# Patient Record
Sex: Female | Born: 2000 | Race: Black or African American | Hispanic: No | Marital: Single | Smoking: Never smoker
Health system: Southern US, Community
[De-identification: ages and names within clinical notes are randomized; demographics above are authoritative.]

## PROBLEM LIST (undated history)

## (undated) DIAGNOSIS — F319 Bipolar disorder, unspecified: Secondary | ICD-10-CM

## (undated) DIAGNOSIS — N390 Urinary tract infection, site not specified: Secondary | ICD-10-CM

## (undated) HISTORY — PX: MYRINGOTOMY: SUR874

## (undated) HISTORY — PX: TONSILLECTOMY: SUR1361

## (undated) HISTORY — PX: ADENOIDECTOMY: SUR15

## (undated) HISTORY — DX: Bipolar disorder, unspecified: F31.9

## (undated) HISTORY — PX: THROAT SURGERY: SHX803

---
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
## 2019-04-19 ENCOUNTER — Ambulatory Visit (HOSPITAL_COMMUNITY)
Admission: EM | Admit: 2019-04-19 | Discharge: 2019-04-19 | Disposition: A | Discharge status: Home / Self Care | Attending: Family Medicine | Admitting: Family Medicine

## 2019-04-19 ENCOUNTER — Other Ambulatory Visit: Payer: Self-pay

## 2019-04-19 DIAGNOSIS — S51812A Laceration without foreign body of left forearm, initial encounter: Secondary | ICD-10-CM

## 2019-04-19 DIAGNOSIS — L089 Local infection of the skin and subcutaneous tissue, unspecified: Secondary | ICD-10-CM

## 2019-04-19 DIAGNOSIS — Z7289 Other problems related to lifestyle: Secondary | ICD-10-CM | POA: Diagnosis not present

## 2019-04-19 DIAGNOSIS — IMO0002 Reserved for concepts with insufficient information to code with codable children: Secondary | ICD-10-CM

## 2019-04-19 DIAGNOSIS — T148XXA Other injury of unspecified body region, initial encounter: Secondary | ICD-10-CM | POA: Diagnosis not present

## 2019-04-19 MED ORDER — CEPHALEXIN 500 MG PO CAPS: 500.0000 mg | ORAL_CAPSULE | Freq: Four times a day (QID) | ORAL | 0 refills | Status: AC

## 2019-04-19 NOTE — ED Triage Notes (Signed)
Pt reports that she inflicted self-wounds to left wrist with razor approx 3 days ago. Reports area of cuts have had some yellow drainage onset yesterday. Pt denies any thoughts of self-harm/death at this time. Pt reports that she has done self-harm of similar actions approx 1 year ago. Pt states she often forgets to take her risperdal and lamictal.

## 2019-04-19 NOTE — ED Provider Notes (Signed)
[facility]    CSN: [id number] Arrival date & time: 04/19/19  1407      History   Chief Complaint Chief Complaint  Patient presents with  . Laceration    HPI Rhonda Moran is a 19 y.o. female history of bipolar type I, presenting today for concerns about infection to self-inflicted wounds.  Patient reports approximately 3 days ago she inflicted wounds to her left wrist with a razor.  She is concerned as of recently she has developed increased redness, pustular drainage as well as wound worsening.  Denies fevers.  Denies difficulty moving wrist elbow or fingers.  She denies any current thoughts of SI/HI.  She is on Risperdal and Lamictal, occasionally will forget to take medicines.  She currently does not wish to discuss any triggers or stresses in life right now.  She previously was seeing psychiatry, but has not since the Covid pandemic began 1 year ago.  HPI  Past Medical History:  Diagnosis Date  . Bipolar 1 disorder (HCC)   . Urinary tract infection     Patient Active Problem List   Diagnosis Date Noted  . 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 Bipolar I disorder (HCC) 02/10/2016  . Bartholin gland cyst 10/07/2014  . Patient has active power of attorney for health care 09/12/2014  . Abnormal vision screen 09/08/2014  . Bipolar disorder (HCC) 09/16/2013    Past Surgical History:  Procedure Laterality Date  . ADENOIDECTOMY    . MYRINGOTOMY Bilateral   . THROAT SURGERY    . TONSILLECTOMY      OB History    Gravida  0   Para  0   Term  0   Preterm  0   AB  0   Living  0     SAB  0   TAB  0   Ectopic  0   Multiple  0   Live Births               Home Medications    Prior to Admission medications   Medication Sig Start Date End Date Taking? Authorizing Provider  lamoTRIgine (LAMICTAL) 100 MG tablet Take 100 mg by mouth daily.   Yes [provider]   risperiDONE (RISPERDAL) 0.5 MG tablet Take 0.5 mg by mouth at bedtime.   Yes [provider]  ARIPiprazole (ABILIFY) 5 MG tablet Take 1 tablet (5 mg total) by mouth at bedtime. 02/13/16   Starkes-Perry, Juel Burrow, FNP  cephALEXin (KEFLEX) 500 MG capsule Take 1 capsule (500 mg total) by mouth 4 (four) times daily for 7 days. 04/19/19 04/26/19  Emmett Bracknell C, PA-C  ondansetron (ZOFRAN-ODT) 4 MG disintegrating tablet Take 1 tablet (4 mg total) by mouth every 8 (eight) hours as needed for nausea or vomiting. 07/10/17   Georgetta Haber, NP  valACYclovir (VALTREX) 500 MG tablet Take 1 tablet (500 mg total) by mouth daily. Patient not taking: Reported on 07/12/2017 03/06/17   Allayne Butcher B, PA-C  gabapentin (NEURONTIN) 300 MG capsule Take 300 mg by mouth daily after breakfast.  04/19/19  [provider]    Family History Family History  Problem Relation Age of Onset  . Depression Sister   . Drug abuse Maternal Grandfather     Social History Social History   Tobacco Use  . Smoking status: Never Smoker  . Smokeless tobacco: Never Used  Substance Use Topics  .  Alcohol use: No    Alcohol/week: 0.0 standard drinks  . Drug use: Yes    Types: Marijuana     Allergies   Patient has no known allergies.   Review of Systems Review of Systems  Constitutional: Negative for fatigue and fever.  Eyes: Negative for visual disturbance.  Respiratory: Negative for shortness of breath.   Cardiovascular: Negative for chest pain.  Gastrointestinal: Negative for abdominal pain, nausea and vomiting.  Musculoskeletal: Negative for arthralgias and joint swelling.  Skin: Positive for color change and wound. Negative for rash.  Neurological: Negative for dizziness, weakness, light-headedness and headaches.  Psychiatric/Behavioral: Positive for self-injury. Negative for suicidal ideas.     Physical Exam Triage Vital Signs ED Triage Vitals  Enc Vitals Group     BP 04/19/19 1426 117/76      Pulse Rate 04/19/19 1426 69     Resp 04/19/19 1426 18     Temp 04/19/19 1426 98.3 F (36.8 C)     Temp Source 04/19/19 1426 Oral     SpO2 04/19/19 1426 100 %     Weight --      Height --      Head Circumference --      Peak Flow --      Pain Score 04/19/19 1421 3     Pain Loc --      Pain Edu? --      Excl. in [place]? --    No data found.  Updated Vital Signs BP 117/76 (BP Location: Right Arm)   Pulse 69   Temp 98.3 F (36.8 C) (Oral)   Resp 18   LMP 03/21/2019   SpO2 100%   Visual Acuity Right Eye Distance:   Left Eye Distance:   Bilateral Distance:    Right Eye Near:   Left Eye Near:    Bilateral Near:     Physical Exam Vitals and nursing note reviewed.  Constitutional:      Appearance: She is well-developed.     Comments: No acute distress  HENT:     Head: Normocephalic and atraumatic.     Nose: Nose normal.  Eyes:     Conjunctiva/sclera: Conjunctivae normal.  Cardiovascular:     Rate and Rhythm: Normal rate.  Pulmonary:     Effort: Pulmonary effort is normal. No respiratory distress.  Abdominal:     General: There is no distension.  Musculoskeletal:        General: Normal range of motion.     Cervical back: Neck supple.     Comments: Full active range of motion at wrists elbows and all fingers, radial pulse 2+  Skin:    General: Skin is warm and dry.     Comments: Multiple horizontal self-inflicted wounds noted to left forearm, largely healing and scabbed, 1 area with surrounding erythema, wound assistance and small amount of drainage  Neurological:     Mental Status: She is alert and oriented to person, place, and time.      UC Treatments / Results  Labs (all labs ordered are listed, but only abnormal results are displayed) Labs Reviewed - No data to display  EKG   Radiology No results found.  Procedures Procedures (including critical care time)  Medications Ordered in UC Medications - No data to display  Initial Impression /  Assessment and Plan / UC Course  I have reviewed the triage vital signs and the nursing notes.  Pertinent labs & imaging results that were available during my care  of the patient were reviewed by me and considered in my medical decision making (see chart for details).     Localized area and concerning for infection/cellulitis.  Initiated on Keflex, discussed wound care and monitoring for gradual healing.  Patient denies being risk to self or others at this time.  Stressed importance of reestablishing care with primary care/psychiatry for further monitoring and management of bipolar.  Advised to follow-up in [place] for behavioral [facility] if developing recurrent self harm thoughts. Resources provided.  Discussed strict return precautions. Patient verbalized understanding and is agreeable with plan.  Final Clinical Impressions(s) / UC Diagnoses   Final diagnoses:  Laceration of left forearm, initial encounter  Wound infection  Self-inflicted injury     Discharge Instructions     Please begin taking Keflex 4 times a day for the next week Please wash area with warm soapy water twice daily, keep clean and dry Monitor for wounds to gradually heal with time  Please follow-up with primary care/psychiatry for further management of your Risperdal/Lamictal If you develop thoughts of harming yourself or others please follow-up at [facility] emergency room or go to behavioral health clinic   ED Prescriptions    Medication Sig Dispense Auth. Provider   cephALEXin (KEFLEX) 500 MG capsule Take 1 capsule (500 mg total) by mouth 4 (four) times daily for 7 days. 28 capsule Josceline Chenard, [place] C, PA-C     PDMP not reviewed this encounter.   Lew Dawes, [place] 04/19/19 1448

## 2019-04-19 NOTE — Discharge Instructions (Addendum)
Please begin taking Keflex 4 times a day for the next week Please wash area with warm soapy water twice daily, keep clean and dry Monitor for wounds to gradually heal with time  Please follow-up with primary care/psychiatry for further management of your Risperdal/Lamictal If you develop thoughts of harming yourself or others please follow-up at [facility] emergency room or go to behavioral health clinic

## 2019-07-10 ENCOUNTER — Ambulatory Visit (HOSPITAL_COMMUNITY)
Admission: EM | Admit: 2019-07-10 | Discharge: 2019-07-10 | Disposition: A | Discharge status: Home / Self Care | Attending: Family Medicine | Admitting: Family Medicine

## 2019-07-10 ENCOUNTER — Other Ambulatory Visit: Payer: Self-pay

## 2019-07-10 ENCOUNTER — Encounter (HOSPITAL_COMMUNITY): Payer: Self-pay

## 2019-07-10 DIAGNOSIS — L02412 Cutaneous abscess of left axilla: Secondary | ICD-10-CM | POA: Diagnosis not present

## 2019-07-10 MED ORDER — DOXYCYCLINE HYCLATE 100 MG PO CAPS: 100.0000 mg | ORAL_CAPSULE | Freq: Two times a day (BID) | ORAL | 0 refills | Status: DC

## 2019-07-10 NOTE — ED Provider Notes (Signed)
[place]   [id number] 07/10/19 Arrival Time: 1406  ASSESSMENT & PLAN:  1. Abscess of axilla, left     Though recommended, she declines I&D today. Prefers trial of antibiotic and hot soaks that have resulted in some drainage. OTC analgesics if needed. Agrees to return if not improving over the next 48 hours.  Begin: Meds ordered this encounter  Medications   doxycycline (VIBRAMYCIN) 100 MG capsule    Sig: Take 1 capsule (100 mg total) by mouth 2 (two) times daily.    Dispense:  20 capsule    Refill:  0    Reviewed expectations re: course of current medical issues. Questions answered. Outlined signs and symptoms indicating need for more acute intervention. Patient verbalized understanding. After Visit Summary given.   SUBJECTIVE:  Rhonda Moran is a 19 y.o. female who presents with a possible infection of her L axilla. Onset gradual, approximately a few days ago; some drainage yesterday after hot compress; without active drainage and without active bleeding. Symptoms have gradually worsened since beginning. Fever: absent. Distant h/o similar.  OBJECTIVE:  Vitals:   07/10/19 1420  BP: 112/67  Pulse: 80  Resp: 18  Temp: 99.7 F (37.6 C)  TempSrc: Oral  SpO2: 98%     General appearance: alert; no distress Skin: approx 1 x 2 cm induration of her L axilla; very fluctuance; tender to touch; no active drainage or bleeding Psychological: alert and cooperative; normal mood and affect  No Known Allergies  Past Medical History:  Diagnosis Date   Bipolar 1 disorder (HCC)    Urinary tract infection    Social History   Socioeconomic History   Marital status: Single    Spouse name: Not on file   Number of children: Not on file   Years of education: Not on file   Highest education level: Not on file  Occupational History   Not on file  Tobacco Use   Smoking status: Never Smoker   Smokeless tobacco: Never Used  Vaping Use   Vaping Use:  Never used  Substance and Sexual Activity   Alcohol use: No    Alcohol/week: 0.0 standard drinks   Drug use: Yes    Types: Marijuana   Sexual activity: Yes    Comment: stopped taking pill 01/05/16  Other Topics Concern   Not on file  Social History Narrative   Not on file   Social Determinants of Health   Financial Resource Strain:    Difficulty of Paying Living Expenses:   Food Insecurity:    Worried About Charity fundraiser in the Last Year:    Arboriculturist in the Last Year:   Transportation Needs:    Film/video editor (Medical):    Lack of Transportation (Non-Medical):   Physical Activity:    Days of Exercise per Week:    Minutes of Exercise per Session:   Stress:    Feeling of Stress :   Social Connections:    Frequency of Communication with Friends and Family:    Frequency of Social Gatherings with Friends and Family:    Attends Religious Services:    Active Member of Clubs or Organizations:    Attends Music therapist:    Marital Status:    Family History  Problem Relation Age of Onset   Depression Sister    Drug abuse Maternal Grandfather    Past Surgical History:  Procedure Laterality Date   ADENOIDECTOMY     MYRINGOTOMY  Bilateral    THROAT SURGERY     TONSILLECTOMY             Mardella Layman, MD 07/10/19 [phone]

## 2019-07-10 NOTE — ED Triage Notes (Addendum)
Pt present a boil underneath her left arm. Symptoms started a few days ago after she shaved and noticed the area started to get bigger.

## 2019-07-10 NOTE — Discharge Instructions (Signed)
Do not use antiperspirant deodorants.

## 2021-09-04 ENCOUNTER — Ambulatory Visit: Payer: Self-pay | Admitting: Behavioral Health

## 2021-10-16 ENCOUNTER — Encounter: Payer: Self-pay | Admitting: Behavioral Health

## 2021-10-16 ENCOUNTER — Ambulatory Visit: Payer: Self-pay | Admitting: Behavioral Health

## 2021-10-16 VITALS — BP 125/83 | HR 86 | Ht 63.0 in | Wt 110.0 lb

## 2021-10-16 DIAGNOSIS — F489 Nonpsychotic mental disorder, unspecified: Secondary | ICD-10-CM

## 2021-10-16 NOTE — Progress Notes (Signed)
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 She was alert, oriented, and articulate. She presented with no apparent signs of current intoxication from substances.  I informed her that I did not do addiction medicine here and that she needed a program that would be more appropriate. I explained that I did not feel comfortable prescribing AP medication with patient currently using multiple illicit substances including Fentanyl. I referred her to [facility] in [place] and provided contact information. I also provided contact information to [facility].  Patient stated that she was not suicidal and felt safe at this time. Also explained to her other emergency resources that are available such as ER or [facility].  Patient is currently also experiencing legal trouble and needing to obtain appropriate treatment for addiction.  I reassured her that after she went through a  program such as IOP and not using illicit substances that I would be willing to treat her at that time.

## 2022-09-17 ENCOUNTER — Ambulatory Visit: Payer: Self-pay | Admitting: Behavioral Health

## 2022-10-24 ENCOUNTER — Encounter (HOSPITAL_COMMUNITY): Payer: Self-pay | Admitting: Emergency Medicine

## 2022-10-24 ENCOUNTER — Ambulatory Visit (HOSPITAL_COMMUNITY)
Admission: EM | Admit: 2022-10-24 | Discharge: 2022-10-24 | Disposition: A | Discharge status: Home / Self Care | Payer: Self-pay | Attending: Internal Medicine | Admitting: Internal Medicine

## 2022-10-24 DIAGNOSIS — N751 Abscess of Bartholin's gland: Secondary | ICD-10-CM

## 2022-10-24 MED ORDER — AMOXICILLIN-POT CLAVULANATE 875-125 MG PO TABS: 1.0000 | ORAL_TABLET | Freq: Two times a day (BID) | ORAL | 0 refills | Status: AC

## 2022-10-24 MED ORDER — IBUPROFEN 600 MG PO TABS: 600.0000 mg | ORAL_TABLET | Freq: Four times a day (QID) | ORAL | 0 refills | Status: AC | PRN

## 2022-10-24 NOTE — ED Triage Notes (Signed)
Pt reports has a bartholin cyst for about 5-6 days and 3 days hs bothered her. Pt is nervous about getting drained. Would like antibiotics.

## 2022-10-24 NOTE — Discharge Instructions (Addendum)
Please do sitz bath 2-3 times a day Please take medications as directed You may take ibuprofen as needed for pain If you have worsening pain, swelling, discharge, fever or chills please go to the emergency department for further evaluation.

## 2022-10-29 NOTE — ED Provider Notes (Signed)
[facility]    CSN: [id number] Arrival date & time: 10/24/22  1927      History   Chief Complaint Chief Complaint  Patient presents with   Abscess    HPI Rhonda Moran is a 22 y.o. female comes to the urgent care with painful swelling of the left mid labia majora.  Started 5 to 6 days ago and has been persistent.  Pain is of moderate severity, throbbing, aggravated by palpation and denies any known relieving factors.  No fever or chills.  No vaginal discharge, dysuria, urgency or frequency.  Patient has had Bartholin cyst infection in the past.  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 Authorizing Provider  amoxicillin-clavulanate (AUGMENTIN) 875-125 MG tablet Take 1 tablet by mouth every 12 (twelve) hours for 7 days. 10/24/22 10/31/22 Yes Azaria Stegman, Britta Mccreedy, MD  ibuprofen (ADVIL) 600 MG tablet Take 1 tablet (600 mg total) by mouth every 6 (six) hours as needed. 10/24/22  Yes Royal Beirne, Britta Mccreedy, MD  risperiDONE (RISPERDAL) 0.5 MG tablet Take 0.5 mg by mouth at bedtime.    [provider]  valACYclovir (VALTREX)  500 MG tablet Take 1 tablet (500 mg total) by mouth daily. Patient not taking: Reported on 07/12/2017 03/06/17   Allayne Butcher B, PA-C  gabapentin (NEURONTIN) 300 MG capsule Take 300 mg by mouth daily after breakfast.  04/19/19  [provider]    Family History Family History  Problem Relation Age of Onset   Depression Sister    Drug abuse Maternal Grandfather     Social History Social History   Tobacco Use   Smoking status: Never   Smokeless tobacco: Never  Vaping Use   Vaping status: Never Used  Substance Use Topics   Alcohol use: No    Alcohol/week: 0.0 standard drinks of alcohol   Drug use: Yes    Types: Marijuana     Allergies   Patient has no known allergies.   Review of Systems Review of Systems As per HPI  Physical Exam Triage Vital Signs ED Triage Vitals  Encounter Vitals Group     BP 10/24/22 1952 125/87     Systolic BP Percentile --      Diastolic BP Percentile --      Pulse Rate 10/24/22  1952 98     Resp 10/24/22 1952 17     Temp 10/24/22 1952 99.6 F (37.6 C)     Temp Source 10/24/22 1952 Oral     SpO2 10/24/22 1952 99 %     Weight --      Height --      Head Circumference --      Peak Flow --      Pain Score 10/24/22 1951 7     Pain Loc --      Pain Education --      Exclude from Growth Chart --    No data found.  Updated Vital Signs BP 125/87 (BP Location: Right Arm)   Pulse 98   Temp 99.6 F (37.6 C) (Oral)   Resp 17   LMP 10/17/2022   SpO2 99%   Visual Acuity Right Eye Distance:   Left Eye Distance:   Bilateral Distance:    Right Eye Near:   Left Eye Near:    Bilateral Near:     Physical Exam Vitals and nursing note reviewed. Exam conducted with a chaperone present.  Constitutional:      General: She is in acute distress.     Appearance: She is not ill-appearing.  Cardiovascular:     Rate and Rhythm: Normal rate and regular rhythm.  Genitourinary:    Comments: Tender swelling in the posterior third of the left  labia majora.  Mild erythema.  No fluctuance or discharge noted.  Swelling measures about 3 inches in the longest diameter. Neurological:     Mental Status: She is alert.      UC Treatments / Results  Labs (all labs ordered are listed, but only abnormal results are displayed) Labs Reviewed - No data to display  EKG   Radiology No results found.  Procedures Procedures (including critical care time)  Medications Ordered in UC Medications - No data to display  Initial Impression / Assessment and Plan / UC Course  I have reviewed the triage vital signs and the nursing notes.  Pertinent labs & imaging results that were available during my care of the patient were reviewed by me and considered in my medical decision making (see chart for details).     1.  Bartholin gland abscess: Patient does not want to have incision and drainage She is requesting antibiotics Augmentin 875-125 mg twice daily for 7 days Ibuprofen 600 mg every 6 hours as needed for pain Patient is encouraged to continue sitz bath's Return precautions given. Final Clinical Impressions(s) / UC Diagnoses   Final diagnoses:  Bartholin's gland abscess     Discharge Instructions      Please do sitz bath 2-3 times a day Please take medications as directed You may take ibuprofen as needed for pain If you have worsening pain, swelling, discharge, fever or chills please go to the emergency department for further evaluation.   ED Prescriptions     Medication Sig Dispense Auth. Provider   amoxicillin-clavulanate (AUGMENTIN) 875-125 MG tablet Take 1 tablet by mouth every 12 (twelve) hours for 7 days. 14 tablet Alecxander Mainwaring, Britta Mccreedy, MD   ibuprofen (ADVIL) 600 MG tablet Take 1 tablet (600 mg total) by mouth every 6 (six) hours as needed. 30 tablet Jacquelynne Guedes, Britta Mccreedy, MD      PDMP not reviewed this encounter.   Merrilee Jansky, MD 10/29/22 [phone]
